# Patient Record
Sex: Male | Born: 1985 | Race: White | Hispanic: No | Marital: Married | Smoking: Current every day smoker
Health system: Southern US, Community
[De-identification: ages and names within clinical notes are randomized; demographics above are authoritative.]

---

## 2004-07-26 ENCOUNTER — Emergency Department: Payer: Self-pay | Admitting: Emergency Medicine

## 2005-09-14 ENCOUNTER — Emergency Department: Payer: Self-pay | Admitting: Emergency Medicine

## 2006-06-18 ENCOUNTER — Emergency Department: Payer: Self-pay | Admitting: Unknown Physician Specialty

## 2007-03-08 ENCOUNTER — Emergency Department: Payer: Self-pay | Admitting: Emergency Medicine

## 2007-03-27 ENCOUNTER — Emergency Department: Payer: Self-pay | Admitting: Emergency Medicine

## 2007-11-16 ENCOUNTER — Emergency Department: Payer: Self-pay | Admitting: Emergency Medicine

## 2008-07-08 ENCOUNTER — Emergency Department: Payer: Self-pay | Admitting: Emergency Medicine

## 2008-09-17 ENCOUNTER — Emergency Department: Payer: Self-pay | Admitting: Emergency Medicine

## 2010-03-23 ENCOUNTER — Emergency Department (HOSPITAL_COMMUNITY): Admission: EM | Admit: 2010-03-23 | Discharge: 2010-03-23 | Payer: Self-pay | Admitting: Emergency Medicine

## 2010-08-19 LAB — URINALYSIS, ROUTINE W REFLEX MICROSCOPIC
Hgb urine dipstick: NEGATIVE
Nitrite: NEGATIVE
Protein, ur: NEGATIVE mg/dL
Specific Gravity, Urine: 1.025 (ref 1.005–1.030)
Urobilinogen, UA: 1 mg/dL (ref 0.0–1.0)

## 2010-10-24 ENCOUNTER — Emergency Department: Payer: Self-pay | Admitting: Emergency Medicine

## 2010-11-28 ENCOUNTER — Emergency Department: Payer: Self-pay | Admitting: Internal Medicine

## 2011-03-24 ENCOUNTER — Emergency Department (HOSPITAL_COMMUNITY)
Admission: EM | Admit: 2011-03-24 | Discharge: 2011-03-24 | Disposition: A | Discharge status: Home / Self Care | Payer: Medicaid Other | Attending: Emergency Medicine | Admitting: Emergency Medicine

## 2011-03-24 DIAGNOSIS — T63461A Toxic effect of venom of wasps, accidental (unintentional), initial encounter: Secondary | ICD-10-CM | POA: Insufficient documentation

## 2011-03-24 DIAGNOSIS — T6391XA Toxic effect of contact with unspecified venomous animal, accidental (unintentional), initial encounter: Secondary | ICD-10-CM | POA: Insufficient documentation

## 2011-09-27 ENCOUNTER — Emergency Department: Payer: Self-pay | Admitting: Emergency Medicine

## 2011-11-17 ENCOUNTER — Emergency Department: Payer: Self-pay | Admitting: Emergency Medicine

## 2011-11-17 LAB — URINALYSIS, COMPLETE
Bacteria: NONE SEEN
Bilirubin,UR: NEGATIVE
Blood: NEGATIVE
Glucose,UR: NEGATIVE mg/dL (ref 0–75)
Ketone: NEGATIVE
Nitrite: NEGATIVE
RBC,UR: <1 /HPF (ref 0–5)

## 2011-11-17 LAB — BASIC METABOLIC PANEL
BUN: 19 mg/dL — ABNORMAL HIGH (ref 7–18)
Chloride: 106 mmol/L (ref 98–107)
Co2: 27 mmol/L (ref 21–32)
EGFR (Non-African Amer.): >60

## 2011-11-17 LAB — CBC
MCH: 28.6 pg (ref 26.0–34.0)
MCHC: 32.9 g/dL (ref 32.0–36.0)
Platelet: 310 10*3/uL (ref 150–440)
WBC: 9.9 10*3/uL (ref 3.8–10.6)

## 2011-12-21 ENCOUNTER — Ambulatory Visit: Payer: Self-pay | Admitting: Urology

## 2012-01-12 ENCOUNTER — Ambulatory Visit: Payer: Self-pay | Admitting: Urology

## 2012-01-19 ENCOUNTER — Ambulatory Visit: Payer: Self-pay | Admitting: Urology

## 2013-08-13 ENCOUNTER — Encounter: Payer: Self-pay | Admitting: Family Medicine

## 2013-08-13 ENCOUNTER — Ambulatory Visit: Payer: Self-pay | Admitting: Family Medicine

## 2013-08-13 VITALS — BP 124/80 | HR 74 | Temp 98.9°F | Resp 16 | Ht 73.0 in | Wt 187.0 lb

## 2013-08-13 DIAGNOSIS — Z0289 Encounter for other administrative examinations: Secondary | ICD-10-CM

## 2013-08-13 DIAGNOSIS — Z0000 Encounter for general adult medical examination without abnormal findings: Secondary | ICD-10-CM

## 2013-08-13 NOTE — Progress Notes (Signed)
Urinalysis-  specific gravity-1.005 Blood- negative Glucose- negative Protein- negative  Patient has multiple dental caries. Otherwise CPE normal See DOT form

## 2014-02-28 IMAGING — CT CT ABD-PELV W/ CM
1 of 4 series · 9 of 32 positions shown, 15 images · IV contrast (isovue)
Comparison: none

REASON FOR EXAM: (1) left hydronephrosis, UPJ obstruction, urology
requests contrasted study; (2)
COMMENTS:

PROCEDURE:     CT  - CT ABDOMEN / PELVIS  W  - November 17, 2011  [DATE]
RESULT:     Comparison:  Renal stone CT performed earlier same day
TECHNIQUE: Multiple axial images of the abdomen and pelvis were performed
from the lung bases to the pubic symphysis, without p.o. contrast and with
100 mL of Isovue 370 intravenous contrast. Delayed contrast images were also
obtained.

[Series 2: 3mm soft tissue · axial · 0.77mm/px · z∈[-1066,-662]mm · 9 of 169 slices shown, 15 images]
[im 17/169  soft-tissue]
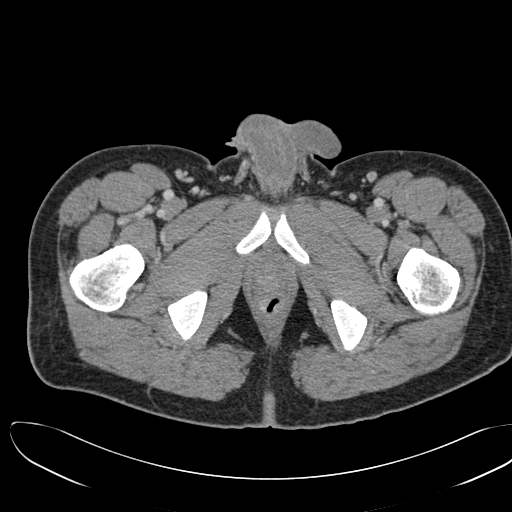
[im 17/169  bone]
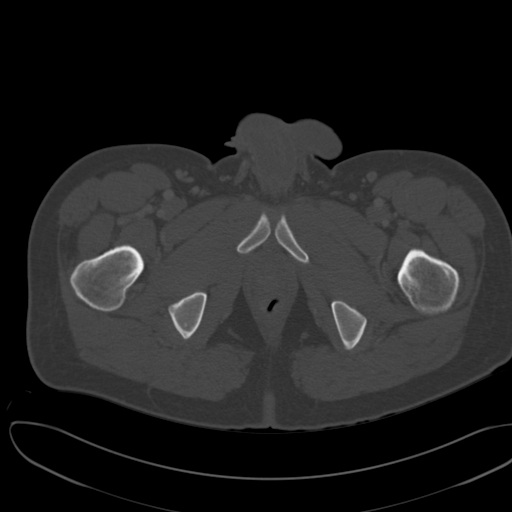
[im 34/169  soft-tissue]
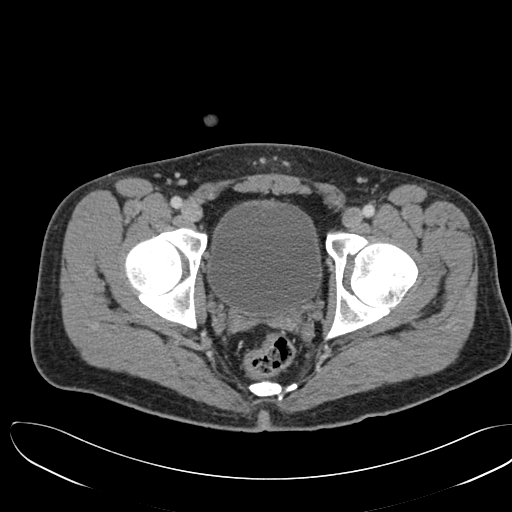
[im 51/169  soft-tissue]
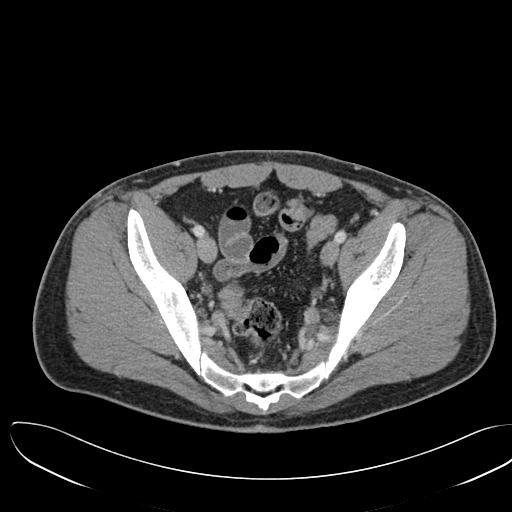
[im 68/169  soft-tissue]
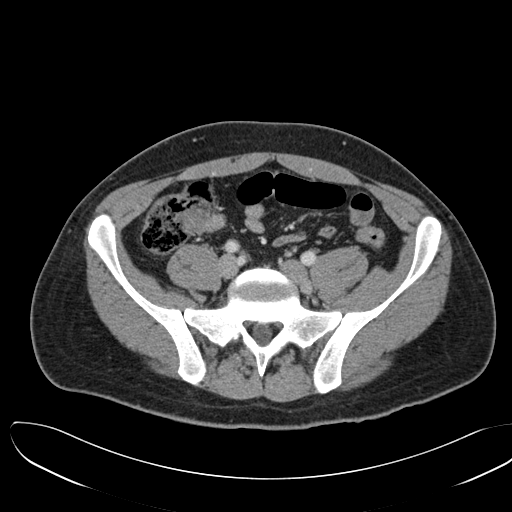
[im 85/169  soft-tissue]
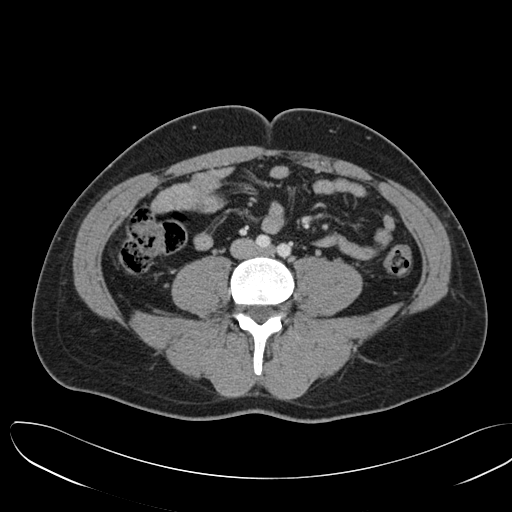
[im 101/169  soft-tissue]
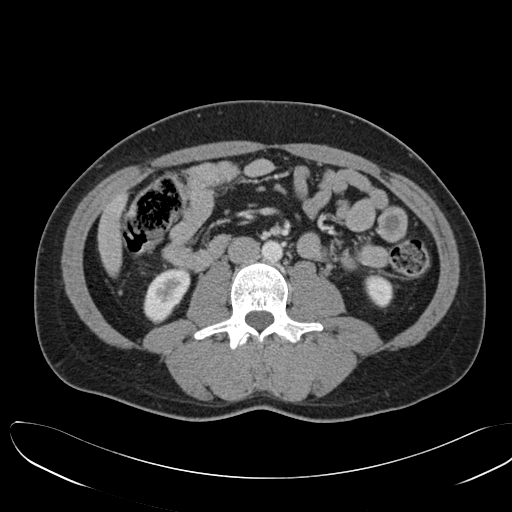
[im 101/169  lung]
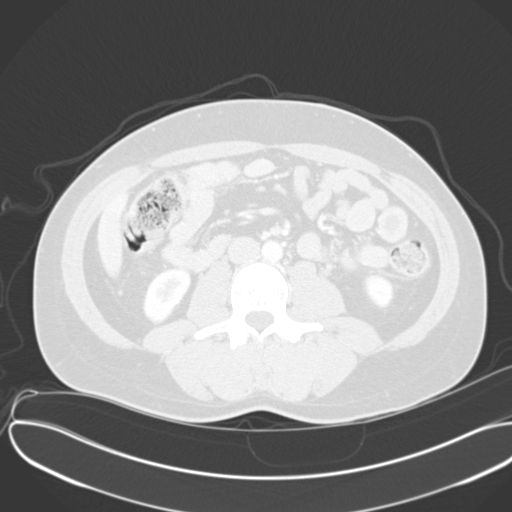
[im 118/169  soft-tissue]
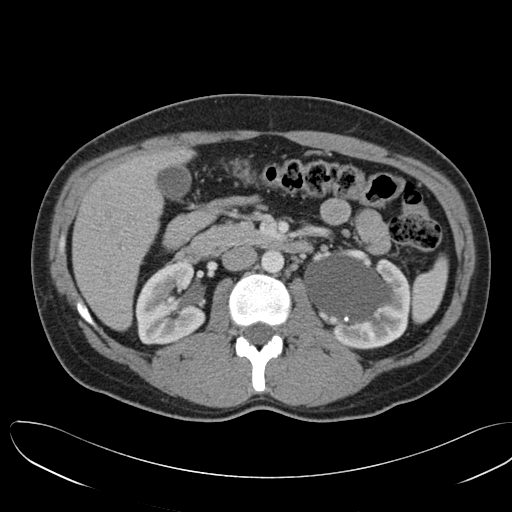
[im 118/169  lung]
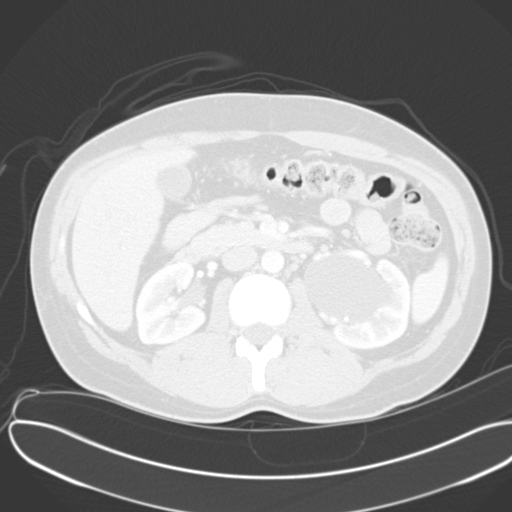
[im 135/169  soft-tissue]
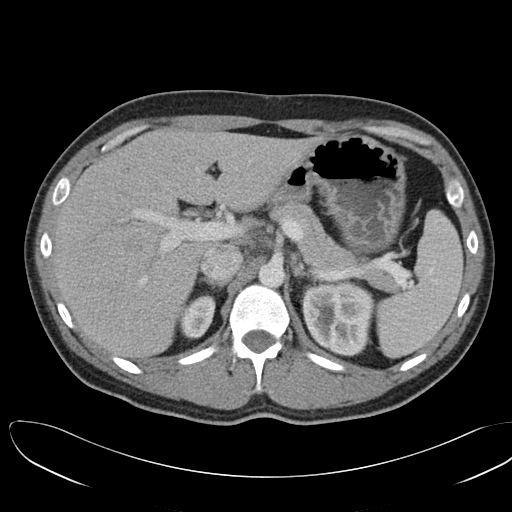
[im 135/169  lung]
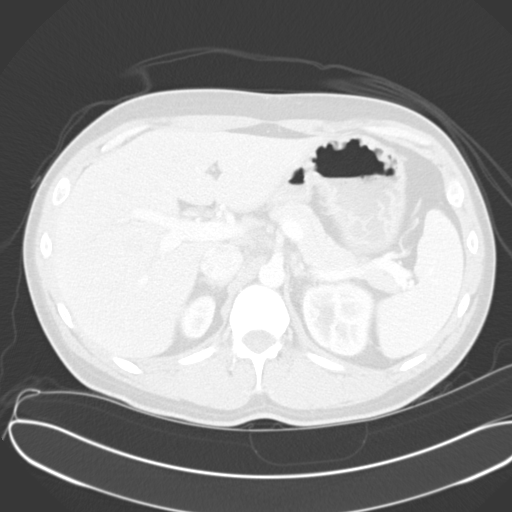
[im 152/169  soft-tissue]
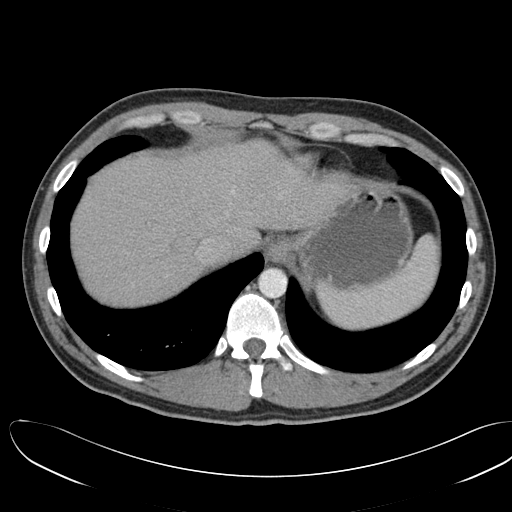
[im 152/169  lung]
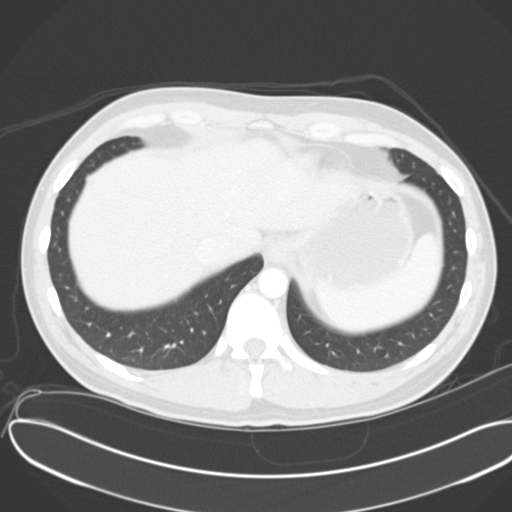
[im 152/169  bone]
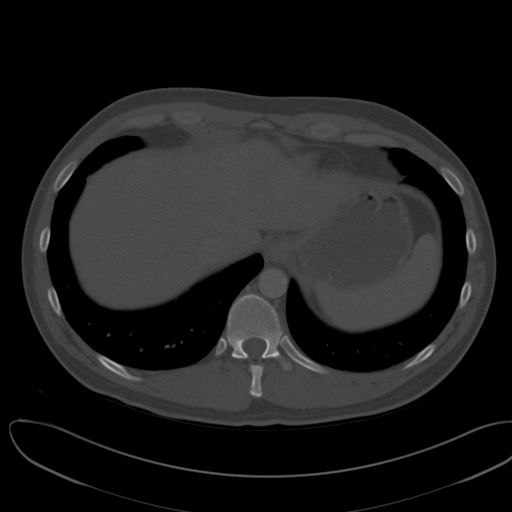

[9 of 32 positions shown; findings below may reference images not displayed]

FINDINGS: There is mild periportal edema in the liver, which is nonspecific. This can
be seen with aggressive hydration. Minimal low-attenuation along the
falciform ligament likely represents focal fatty deposition. The
gallbladder, spleen, adrenals, and pancreas are unremarkable.

There are multiple 5 mm calculi in the left kidney. Again demonstrated is
moderate to severe left pelvicaliectasis. This is seen to the ureteropelvic
junction. The left ureter is normal in caliber. On the postcontrast images,
there is opacification of the left renal pelvis. However, there is no
opacification of the left ureter. The right ureter is normal in caliber. The
mid right ureter is not opacified, limiting evaluation in this region. There
is normal enhancement of the kidneys bilaterally. No extrinsic mass seen in
the region of the left ureteropelvic junction.

The small and large bowel are normal in caliber. The appendix is normal. The
previously demonstrated areas of prominent bowel wall thickness in the right
in the abdomen are not appreciated on this study.

No aggressive lytic or sclerotic osseous lesions are identified.
IMPRESSION: 1. Moderate to severe left pelvicaliectasis, likely related to a uteropelvic
junction obstruction, congenital or acquired. There is opacification of the
left renal pelvis on the delayed images. However, lack of opacification at
the ureteropelvic junction limits evaluation for an intraluminal mass at the
uteropelvic junction. No extrinsic mass identified. Further evaluation could
be provided with direct visualization, as indicated.
2. Left-sided nephrolithiasis.

## 2014-02-28 IMAGING — CT CT STONE STUDY
1 of 2 series · 15 of 32 positions shown, 19 images · non-contrast
Comparison: none

REASON FOR EXAM: left flank pain, h/o kidney stones
COMMENTS:

PROCEDURE:     CT  - CT ABDOMEN /PELVIS WO (STONE)  - November 17, 2011  [DATE]
RESULT:     Comparison: None
TECHNIQUE: Multiple axial images from the lung bases to the symphysis pubis
were obtained without oral and without intravenous contrast.

[Series 2: 3mm soft tissue · axial · 0.76mm/px · z∈[-1114,-652]mm · 15 of 169 slices shown, 19 images]
[im 8/169  soft-tissue]
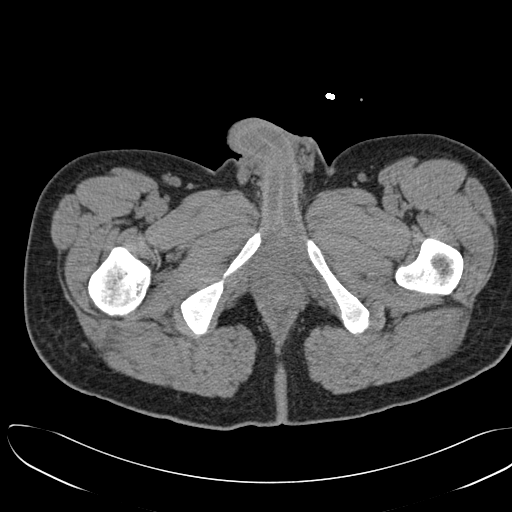
[im 8/169  bone]
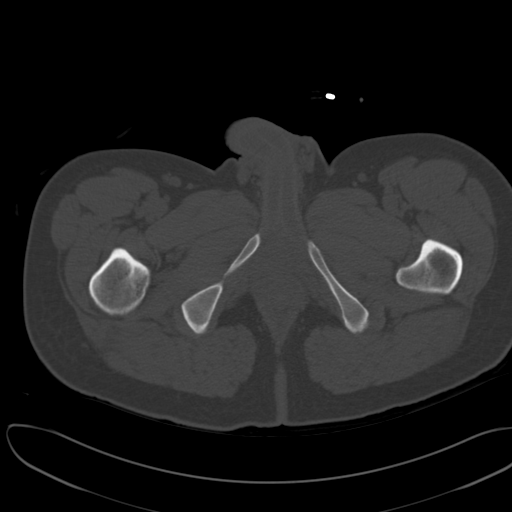
[im 22/169  soft-tissue]
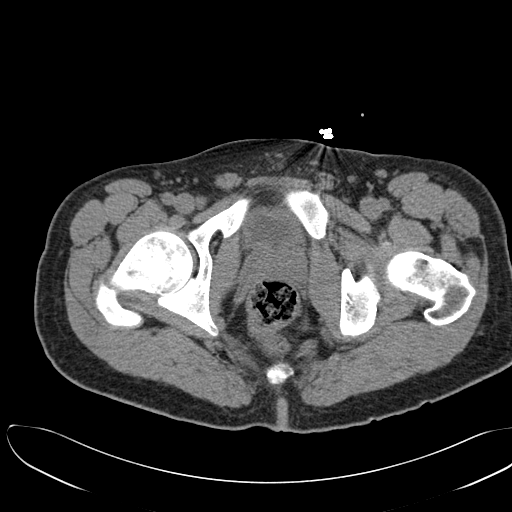
[im 36/169  soft-tissue]
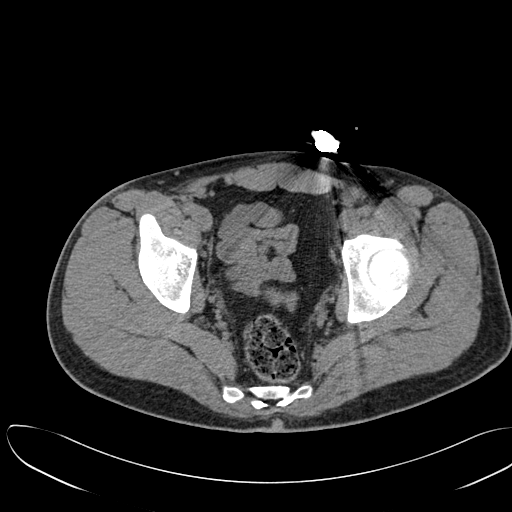
[im 50/169  soft-tissue]
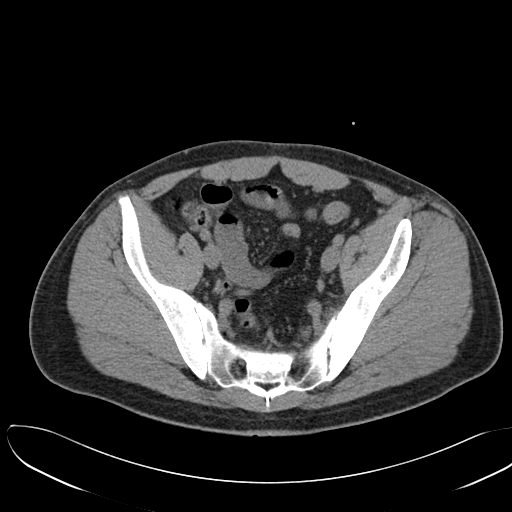
[im 57/169  soft-tissue]
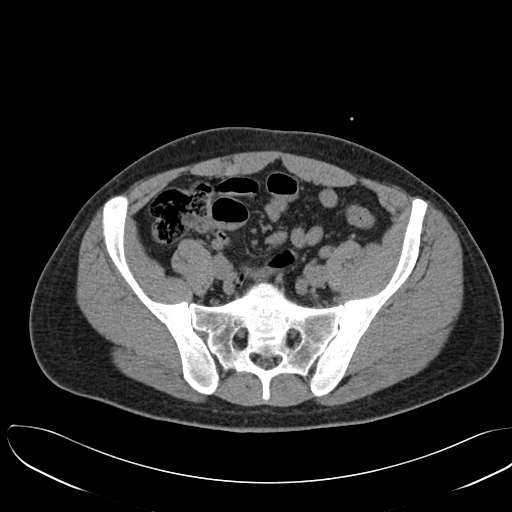
[im 71/169  soft-tissue]
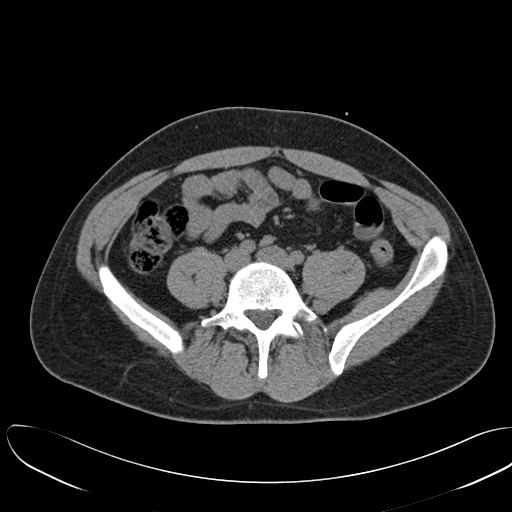
[im 85/169  soft-tissue]
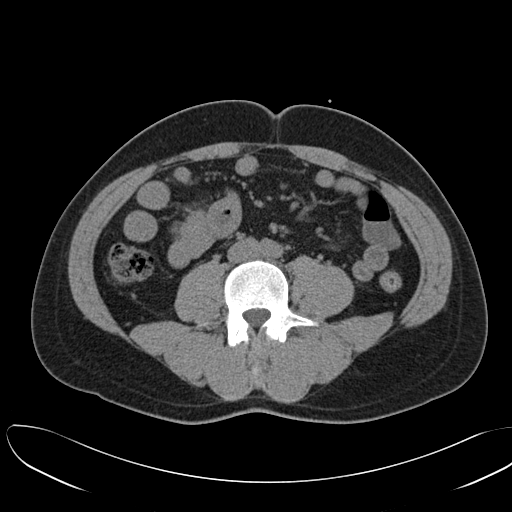
[im 99/169  soft-tissue]
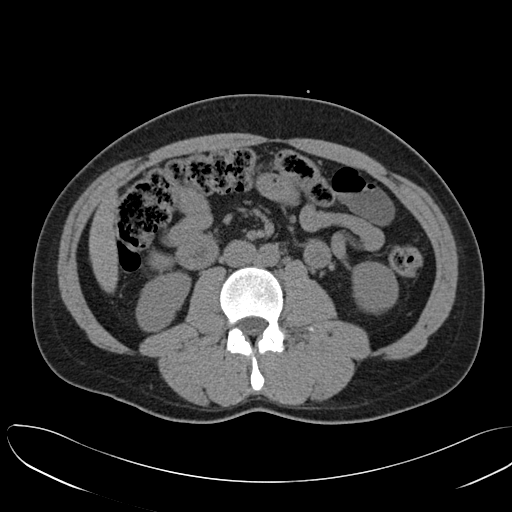
[im 113/169  soft-tissue]
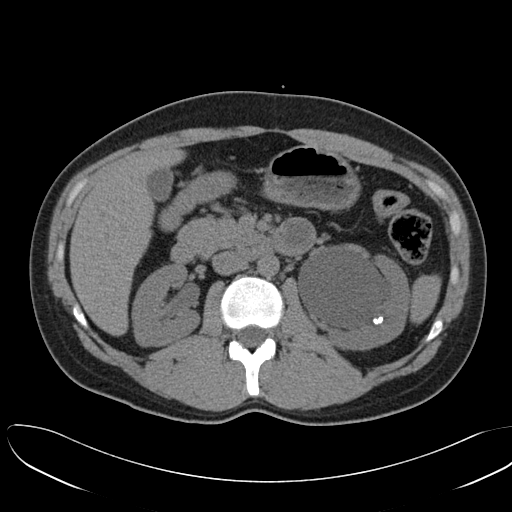
[im 113/169  bone]
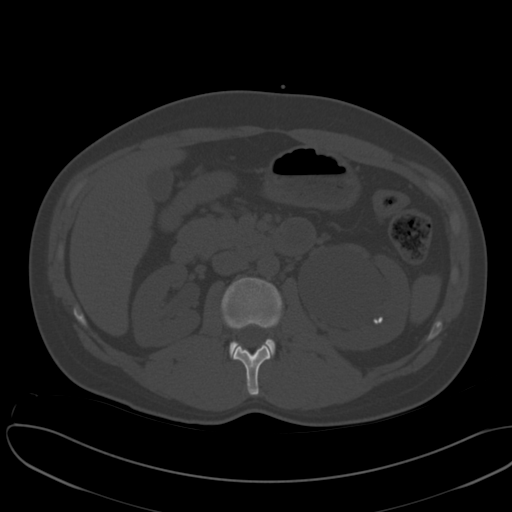
[im 120/169  soft-tissue]
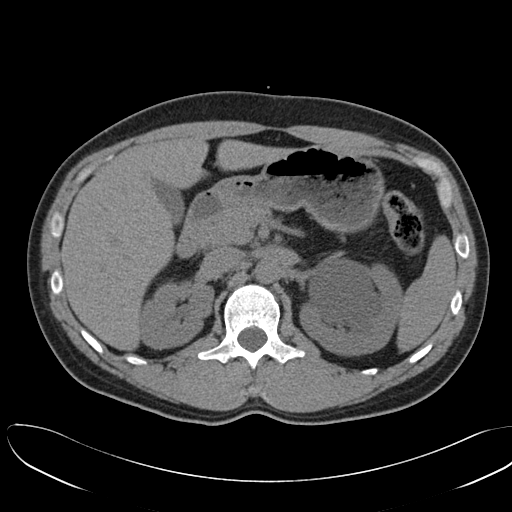
[im 134/169  soft-tissue]
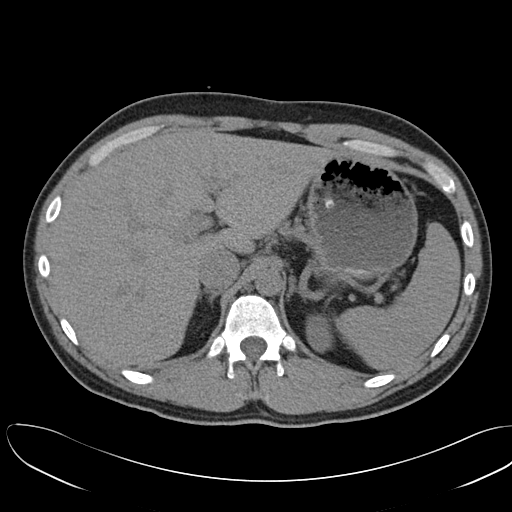
[im 141/169  lung]
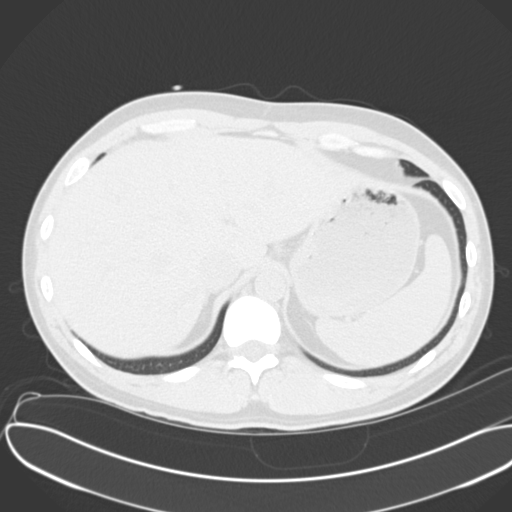
[im 148/169  soft-tissue]
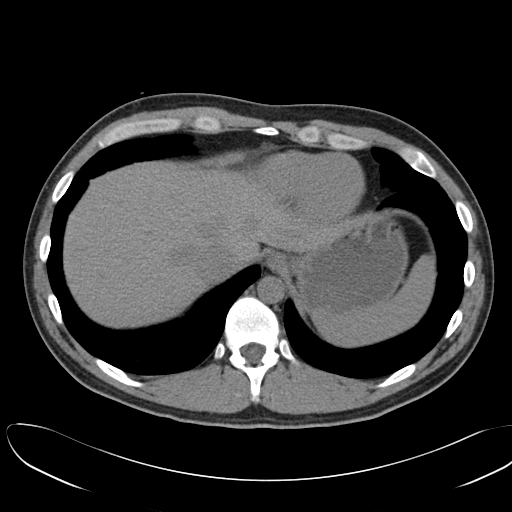
[im 148/169  lung]
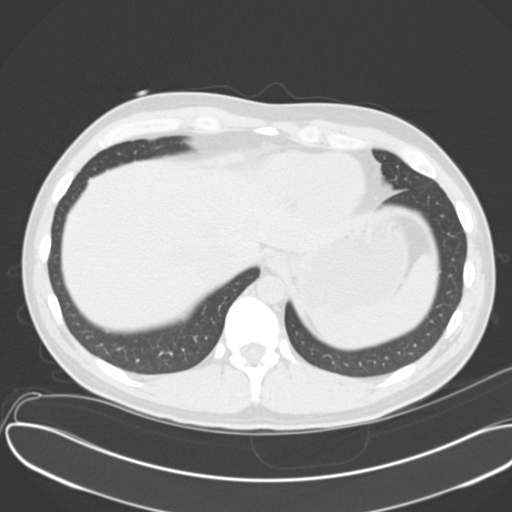
[im 155/169  lung]
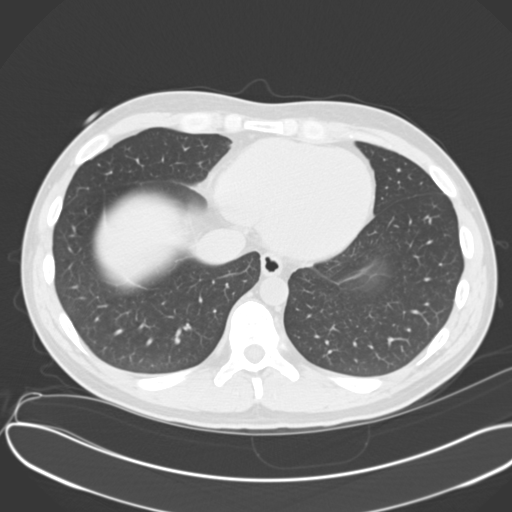
[im 162/169  soft-tissue]
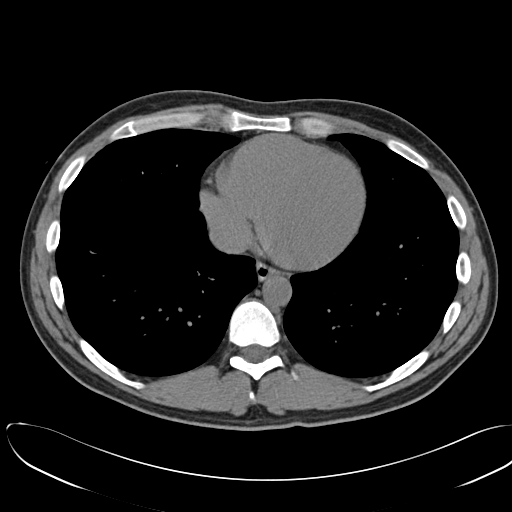
[im 162/169  lung]
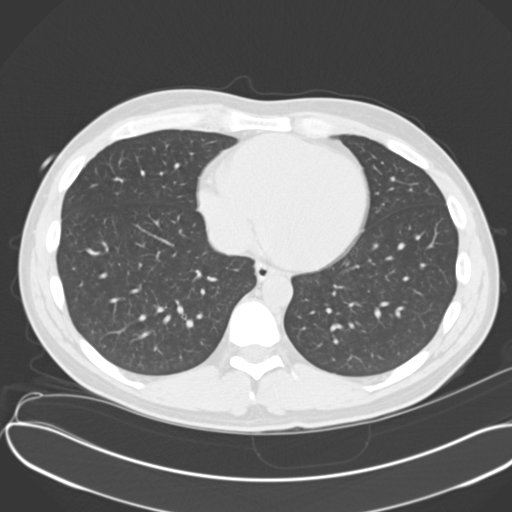

[15 of 32 positions shown; findings below may reference images not displayed]

FINDINGS: Lack of intravenous contrast limits evaluation of the solid abdominal
organs.  Grossly, the liver, the gallbladder, the spleen, adrenals, and
pancreas are unremarkable.

There are several 5-mm calculi in the left kidney. There is moderate to
severe dilatation of the left renal pelvis. The left ureter is normal in
caliber. No ureterectasis.

The small and large bowel are normal in caliber. There is mild prominence of
the bowel wall of several loops of small bowel in the right hemiabdomen. The
appendix is normal.

No aggressive lytic or sclerotic osseous lesions are identified.
IMPRESSION: 1. Left-sided nephrolithiasis. There is moderate to severe dilatation of the
left renal collecting system likely secondary to the ureteropelvic junction
obstruction, congenital or acquired.  Urology consultation is suggested.
2. There is mild prominence of the bowel wall of several loops of small
bowel in the right hemiabdomen. This is nonspecific. Correlate for enteritis.

## 2014-09-24 NOTE — Op Note (Signed)
PATIENT NAME:  Adam Conrad, Adam Conrad MR#:  [id number] DATE OF BIRTH:  [birth year]  DATE OF PROCEDURE:  01/19/2012  PREOPERATIVE DIAGNOSIS: Left ureteropelvic junction obstruction.   POSTOPERATIVE DIAGNOSIS: Left ureteropelvic junction obstruction.   PROCEDURE: Placement of left ureteral stent.   SURGEON: John Giovanni, M.D.   ASSISTANT: None.   ANESTHESIA: General.   INDICATIONS: This is a 29 year old male with a history of intermittent severe renal colic. CT showed marked hydronephrosis but no evidence of stone. This was consistent with a ureteropelvic junction obstruction. Lasix renogram was remarkable for obstruction. He has an appointment with [facility] urology for consideration of definitive care. Since his Lasix renogram he has considered increased pain and having to take Percocet regularly. Stent placement was recommended until his [facility] appointment.   DESCRIPTION OF PROCEDURE: The patient was taken to the operating room where a general anesthetic was administered. He was placed in the low lithotomy position and his external genitalia were prepped and draped in the usual fashion. Time-out was performed per protocol. A 21 French cystoscope with 30-degree lens was lubricated and passed under direct vision. The urethra was normal in caliber without stricture. Prostate was nonocclusive. Bladder mucosa was closely inspected with 30 and 70 degrees lenses and there was no mucosal erythema, solid or papillary lesions. The right ureteral orifice was normal-appearing with clear efflux. The left ureteral orifice was normal-appearing but no efflux was identified after approximately 60 seconds of observation. A 0.035 guidewire was placed through the cystoscope and passed up to the mid ureter under fluoroscopic guidance. A 5 French open-ended ureteral catheter was then placed over the wire. Retrograde pyelogram was performed which shows normal-appearing ureter and marked left hydronephrosis. There appeared to be a higher  insertion of the ureter at the UPJ. No filling defect was identified. The guidewire was placed back into the ureteral catheter needle easily passed into the renal pelvis under fluoroscopic guidance. A 6 French Microvasive Contour ureteral stent met resistance just within the distal ureter and would not advance. A of 4.8 French/26-cm Microvasive Percuflex stent was placed without difficulty. There was good curl seen in the renal pelvis under fluoroscopic guidance. The bladder was emptied and the cystoscope was removed. The patient was taken to the PAC-U in stable condition. There were no complications. Estimated blood loss was zero.    ____________________________ Ronda Fairly Bernardo Heater, MD scs:bjt D: 01/20/2012 07:46:02 ET T: 01/20/2012 09:34:48 ET JOB#: [id number]  cc: Nicki Reaper C. Bernardo Heater, MD, <Dictator> Abbie Sons MD ELECTRONICALLY SIGNED 01/27/2012 20:29
